# Patient Record
Sex: Female | Born: 1960 | Race: Black or African American | Hispanic: No | Marital: Married | Smoking: Never smoker
Health system: Southern US, Community
[De-identification: ages and names within clinical notes are randomized; demographics above are authoritative.]

---

## 2010-03-17 ENCOUNTER — Emergency Department (HOSPITAL_BASED_OUTPATIENT_CLINIC_OR_DEPARTMENT_OTHER)
Admission: EM | Admit: 2010-03-17 | Discharge: 2010-03-17 | Payer: Self-pay | Source: Home / Self Care | Admitting: Emergency Medicine

## 2010-03-18 LAB — POCT CARDIAC MARKERS
CKMB, poc: 1.3 ng/mL (ref 1.0–8.0)
Myoglobin, poc: 55.8 ng/mL (ref 12–200)
Troponin i, poc: <0.05 ng/mL (ref 0.00–0.09)

## 2010-03-18 LAB — BASIC METABOLIC PANEL
BUN: 13 mg/dL (ref 6–23)
CO2: 26 mEq/L (ref 19–32)
Calcium: 9.9 mg/dL (ref 8.4–10.5)
Chloride: 103 mEq/L (ref 96–112)
Creatinine, Ser: 0.7 mg/dL (ref 0.4–1.2)
GFR calc Af Amer: >60 mL/min (ref >60)
GFR calc non Af Amer: >60 mL/min (ref >60)
Glucose, Bld: 85 mg/dL (ref 70–99)
Potassium: 4.3 mEq/L (ref 3.5–5.1)
Sodium: 143 mEq/L (ref 135–145)

## 2010-03-18 LAB — DIFFERENTIAL
Basophils Absolute: 0 10*3/uL (ref 0.0–0.1)
Basophils Relative: 0 % (ref 0–1)
Eosinophils Absolute: 0.1 10*3/uL (ref 0.0–0.7)
Eosinophils Relative: 1 % (ref 0–5)
Lymphocytes Relative: 27 % (ref 12–46)
Lymphs Abs: 2.3 10*3/uL (ref 0.7–4.0)
Monocytes Absolute: 0.5 10*3/uL (ref 0.1–1.0)
Monocytes Relative: 6 % (ref 3–12)
Neutro Abs: 5.7 10*3/uL (ref 1.7–7.7)
Neutrophils Relative %: 66 % (ref 43–77)

## 2010-03-18 LAB — CBC
HCT: 41.7 % (ref 36.0–46.0)
Hemoglobin: 14.1 g/dL (ref 12.0–15.0)
MCH: 30.1 pg (ref 26.0–34.0)
MCHC: 33.8 g/dL (ref 30.0–36.0)
MCV: 89.1 fL (ref 78.0–100.0)
Platelets: 264 10*3/uL (ref 150–400)
RBC: 4.68 MIL/uL (ref 3.87–5.11)
RDW: 12.4 % (ref 11.5–15.5)
WBC: 8.7 10*3/uL (ref 4.0–10.5)

## 2012-03-22 IMAGING — CR DG CERVICAL SPINE COMPLETE 4+V
7 series · 7 of 7 positions shown · non-contrast
Comparison: None.

CLINICAL DATA: Left-sided neck pain.  Arm numbness.  No known
injury.

CERVICAL SPINE - COMPLETE 4+ VIEW

[w c-spine lat]
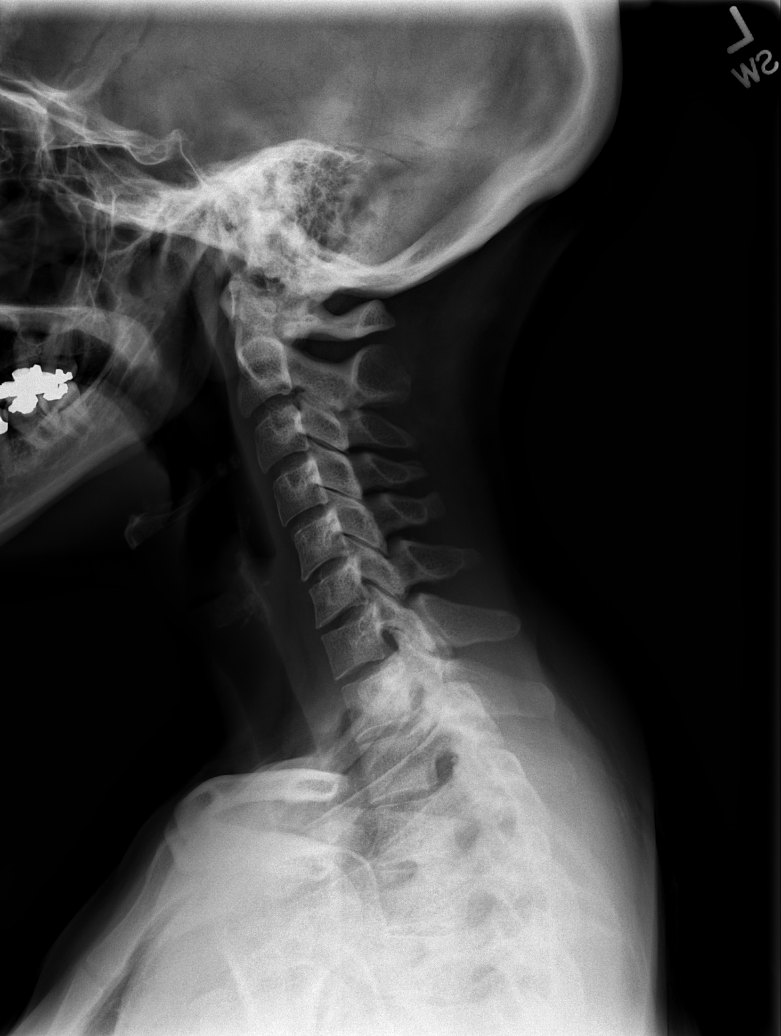

[w c-spine oblique (1 of 2)]
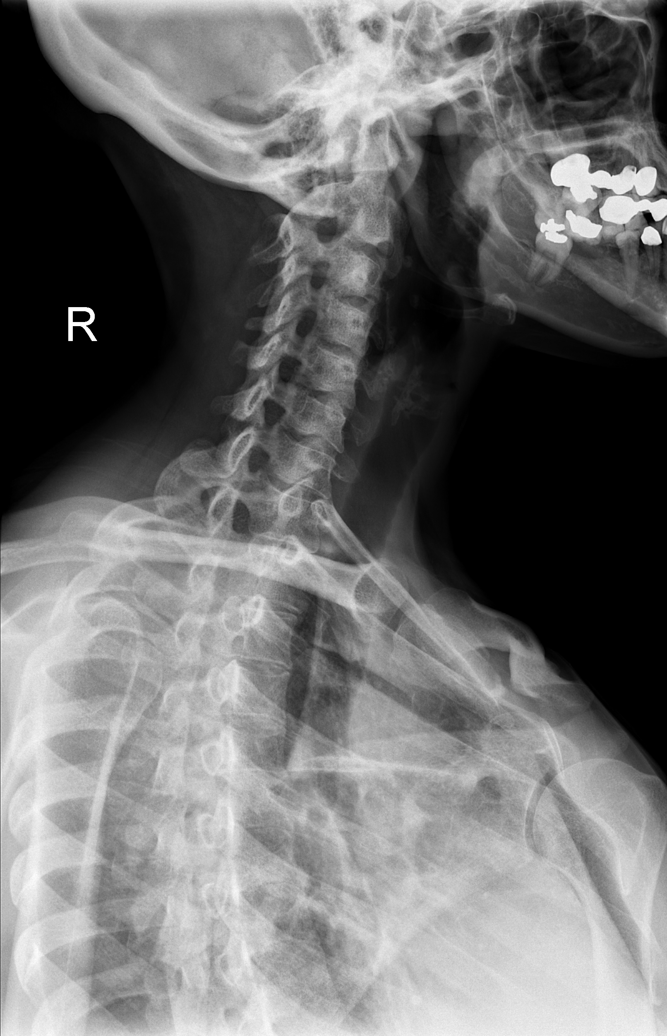

[w c-spine oblique (2 of 2)]
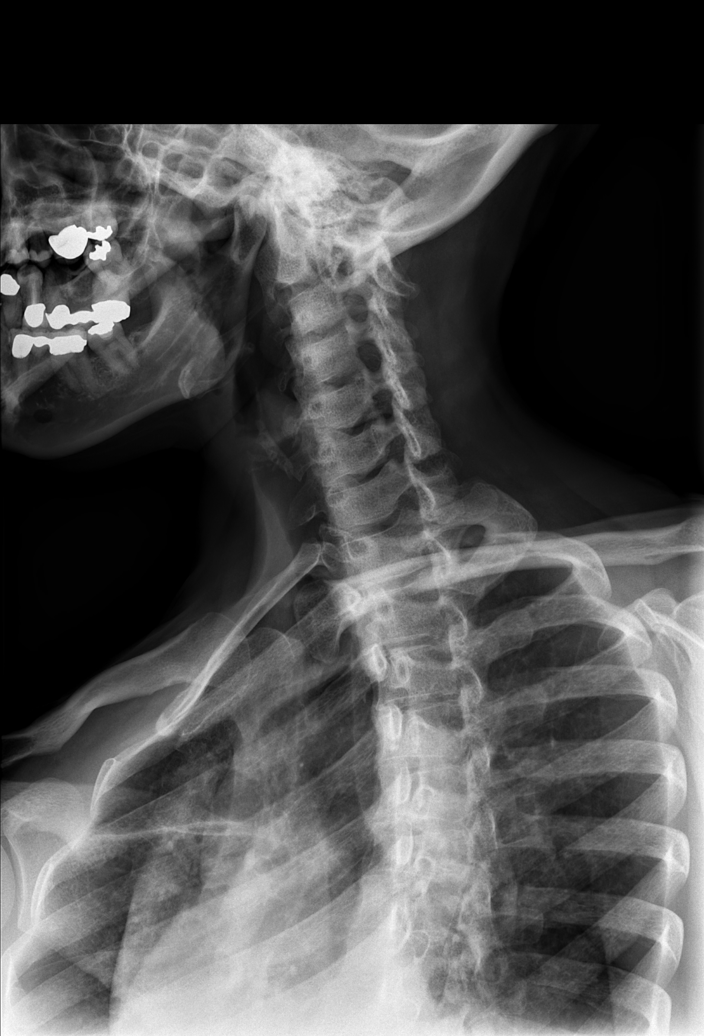

[w c-spine a.p.]
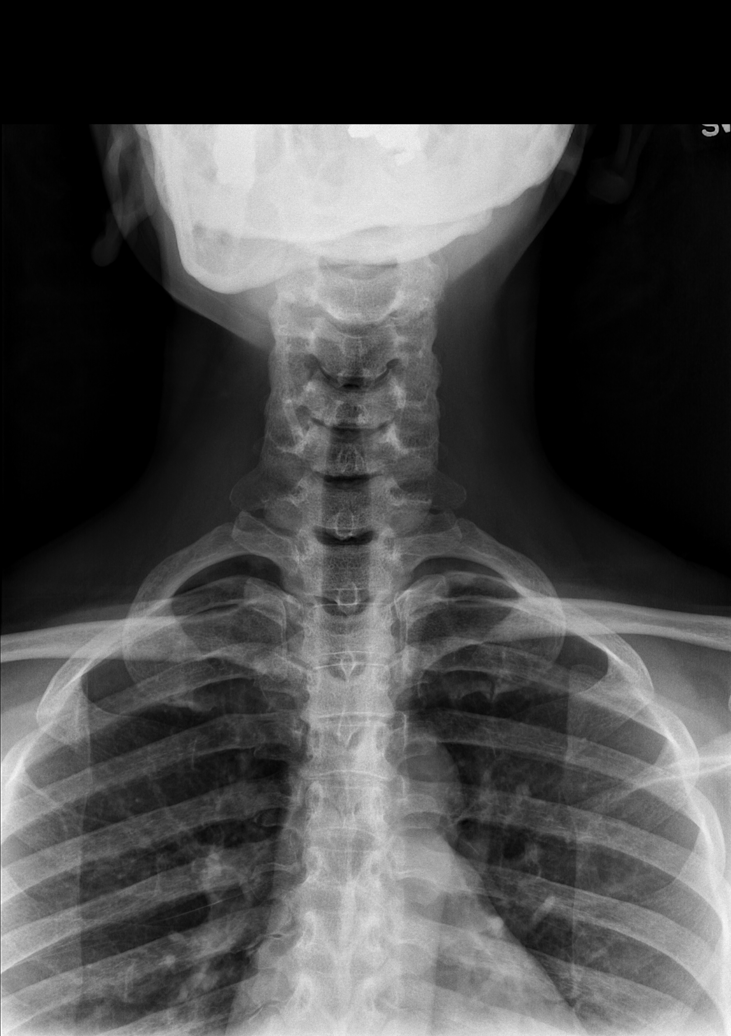

[w c-spine odontoid (1 of 3)]
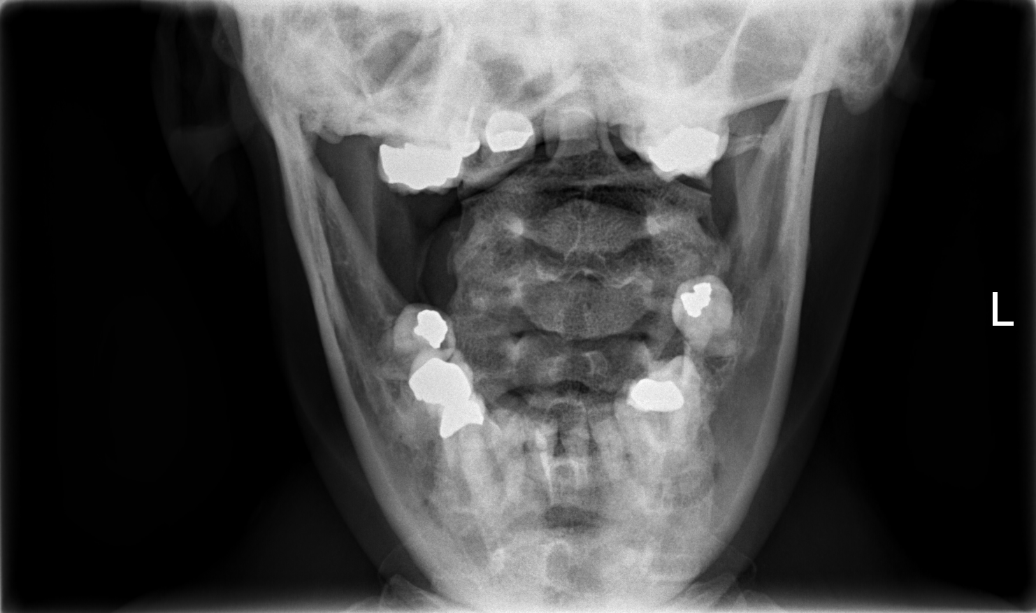

[w c-spine odontoid (2 of 3)]
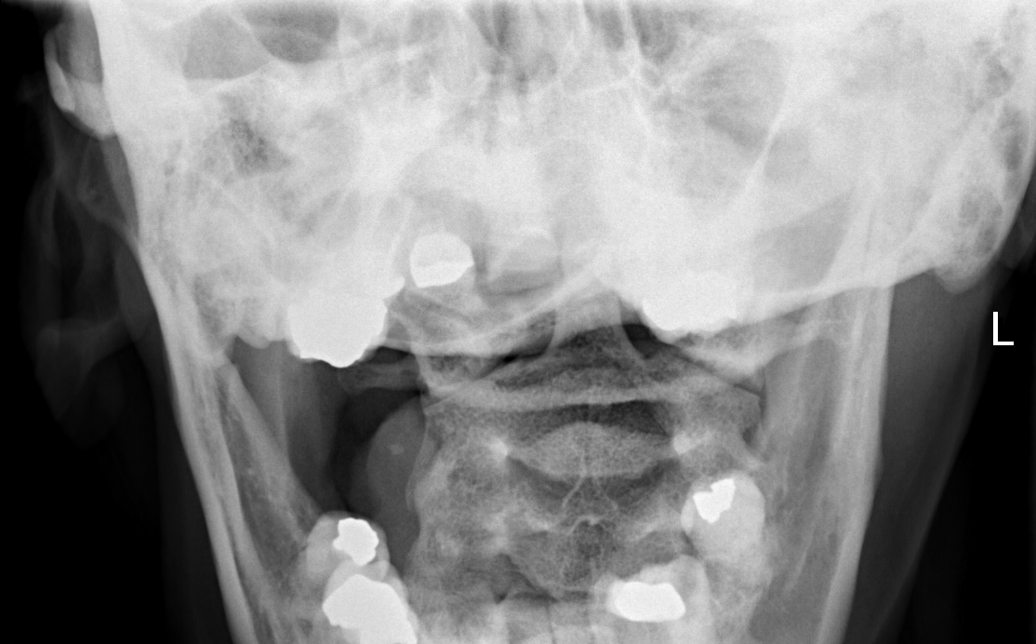

[w c-spine odontoid (3 of 3)]
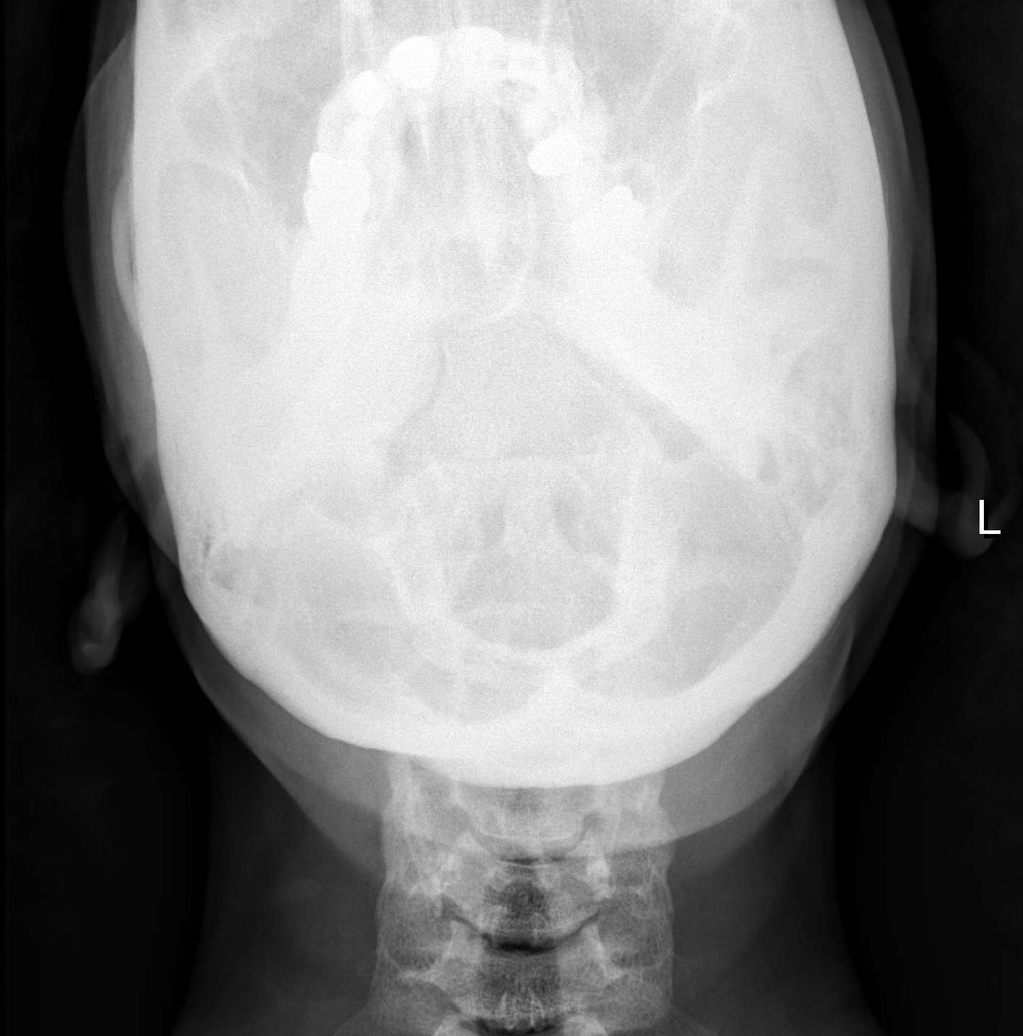

[7 of 7 positions shown; findings below may reference images not displayed]

FINDINGS: There is no evidence of acute fracture.  Vertebral body
heights and disc spaces are maintained.  The neural foramen are
patent.  No prevertebral soft tissue swelling is seen.  There is
straightening of the normal cervical lordosis which could be
positional or related to muscle spasm.  The lung apices are clear.
IMPRESSION: No acute fracture.  Straightening of the normal cervical lordosis
which could be related to positioning or muscle spasm.

## 2016-03-04 ENCOUNTER — Emergency Department (HOSPITAL_BASED_OUTPATIENT_CLINIC_OR_DEPARTMENT_OTHER)
Admission: EM | Admit: 2016-03-04 | Discharge: 2016-03-04 | Disposition: A | Discharge status: Home / Self Care | Payer: Self-pay | Attending: Dermatology | Admitting: Dermatology

## 2016-03-04 ENCOUNTER — Encounter (HOSPITAL_BASED_OUTPATIENT_CLINIC_OR_DEPARTMENT_OTHER): Payer: Self-pay | Admitting: *Deleted

## 2016-03-04 DIAGNOSIS — R079 Chest pain, unspecified: Secondary | ICD-10-CM | POA: Insufficient documentation

## 2016-03-04 DIAGNOSIS — Z5321 Procedure and treatment not carried out due to patient leaving prior to being seen by health care provider: Secondary | ICD-10-CM | POA: Insufficient documentation

## 2016-03-04 NOTE — ED Triage Notes (Addendum)
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 Started with hurting when she raised her arm, went away and came back at work today.

## 2016-03-04 NOTE — ED Notes (Signed)
Pt called x 2 to treatment room with no answer from lobby.
